# Patient Record
Sex: Female | Born: 1985 | Race: White | Hispanic: No
Health system: Southern US, Community
[De-identification: ages and names within clinical notes are randomized; demographics above are authoritative.]

---

## 2008-12-14 ENCOUNTER — Other Ambulatory Visit: Admission: RE | Admit: 2008-12-14 | Discharge: 2008-12-14 | Payer: Self-pay | Admitting: Unknown Physician Specialty

## 2010-01-10 ENCOUNTER — Other Ambulatory Visit
Admission: RE | Admit: 2010-01-10 | Discharge: 2010-01-10 | Payer: Self-pay | Source: Home / Self Care | Admitting: Obstetrics and Gynecology

## 2010-03-15 ENCOUNTER — Other Ambulatory Visit: Payer: Self-pay | Admitting: Obstetrics & Gynecology

## 2010-03-15 DIAGNOSIS — Z0489 Encounter for examination and observation for other specified reasons: Secondary | ICD-10-CM

## 2010-03-15 DIAGNOSIS — O2690 Pregnancy related conditions, unspecified, unspecified trimester: Secondary | ICD-10-CM

## 2010-03-21 ENCOUNTER — Ambulatory Visit (HOSPITAL_COMMUNITY)
Admission: RE | Admit: 2010-03-21 | Discharge: 2010-03-21 | Disposition: A | Discharge status: Home / Self Care | Payer: Medicaid Other | Source: Ambulatory Visit | Attending: Obstetrics & Gynecology | Admitting: Obstetrics & Gynecology

## 2010-03-21 ENCOUNTER — Other Ambulatory Visit: Payer: Self-pay | Admitting: Obstetrics & Gynecology

## 2010-03-21 DIAGNOSIS — Z0489 Encounter for examination and observation for other specified reasons: Secondary | ICD-10-CM

## 2010-03-21 DIAGNOSIS — O2690 Pregnancy related conditions, unspecified, unspecified trimester: Secondary | ICD-10-CM

## 2010-03-21 DIAGNOSIS — IMO0001 Reserved for inherently not codable concepts without codable children: Secondary | ICD-10-CM | POA: Insufficient documentation

## 2010-03-21 DIAGNOSIS — Z3689 Encounter for other specified antenatal screening: Secondary | ICD-10-CM | POA: Insufficient documentation

## 2010-03-21 DIAGNOSIS — Q999 Chromosomal abnormality, unspecified: Secondary | ICD-10-CM

## 2010-04-10 ENCOUNTER — Ambulatory Visit (HOSPITAL_COMMUNITY): Payer: Medicaid Other

## 2010-04-12 ENCOUNTER — Other Ambulatory Visit: Payer: Self-pay | Admitting: Obstetrics & Gynecology

## 2010-04-12 ENCOUNTER — Ambulatory Visit (HOSPITAL_COMMUNITY)
Admission: RE | Admit: 2010-04-12 | Discharge: 2010-04-12 | Disposition: A | Discharge status: Home / Self Care | Payer: Medicaid Other | Source: Ambulatory Visit | Attending: Obstetrics & Gynecology | Admitting: Obstetrics & Gynecology

## 2010-04-12 DIAGNOSIS — O358XX0 Maternal care for other (suspected) fetal abnormality and damage, not applicable or unspecified: Secondary | ICD-10-CM | POA: Insufficient documentation

## 2010-04-12 DIAGNOSIS — Q999 Chromosomal abnormality, unspecified: Secondary | ICD-10-CM

## 2010-04-12 DIAGNOSIS — IMO0001 Reserved for inherently not codable concepts without codable children: Secondary | ICD-10-CM | POA: Insufficient documentation

## 2010-04-12 DIAGNOSIS — Z3689 Encounter for other specified antenatal screening: Secondary | ICD-10-CM | POA: Insufficient documentation

## 2010-04-19 ENCOUNTER — Other Ambulatory Visit: Payer: Self-pay | Admitting: Obstetrics & Gynecology

## 2010-04-19 ENCOUNTER — Ambulatory Visit (HOSPITAL_COMMUNITY)
Admission: RE | Admit: 2010-04-19 | Discharge: 2010-04-19 | Disposition: A | Discharge status: Home / Self Care | Payer: Medicaid Other | Source: Ambulatory Visit | Attending: Obstetrics & Gynecology | Admitting: Obstetrics & Gynecology

## 2010-04-19 DIAGNOSIS — O358XX0 Maternal care for other (suspected) fetal abnormality and damage, not applicable or unspecified: Secondary | ICD-10-CM

## 2010-04-19 DIAGNOSIS — Q999 Chromosomal abnormality, unspecified: Secondary | ICD-10-CM

## 2010-04-19 DIAGNOSIS — IMO0001 Reserved for inherently not codable concepts without codable children: Secondary | ICD-10-CM | POA: Insufficient documentation

## 2010-04-19 DIAGNOSIS — O26879 Cervical shortening, unspecified trimester: Secondary | ICD-10-CM

## 2010-04-20 ENCOUNTER — Ambulatory Visit (HOSPITAL_COMMUNITY): Payer: Medicaid Other

## 2010-04-26 ENCOUNTER — Ambulatory Visit (HOSPITAL_COMMUNITY)
Admission: RE | Admit: 2010-04-26 | Discharge: 2010-04-26 | Disposition: A | Discharge status: Home / Self Care | Payer: Medicaid Other | Source: Ambulatory Visit | Attending: Obstetrics & Gynecology | Admitting: Obstetrics & Gynecology

## 2010-04-26 ENCOUNTER — Other Ambulatory Visit: Payer: Self-pay | Admitting: Obstetrics & Gynecology

## 2010-04-26 ENCOUNTER — Ambulatory Visit (HOSPITAL_COMMUNITY): Admission: RE | Admit: 2010-04-26 | Payer: Medicaid Other | Source: Ambulatory Visit

## 2010-04-26 ENCOUNTER — Encounter (HOSPITAL_COMMUNITY): Payer: Self-pay

## 2010-04-26 ENCOUNTER — Other Ambulatory Visit (HOSPITAL_COMMUNITY): Payer: Medicaid Other

## 2010-04-26 DIAGNOSIS — O26879 Cervical shortening, unspecified trimester: Secondary | ICD-10-CM | POA: Insufficient documentation

## 2010-04-26 DIAGNOSIS — O358XX0 Maternal care for other (suspected) fetal abnormality and damage, not applicable or unspecified: Secondary | ICD-10-CM | POA: Insufficient documentation

## 2010-04-26 DIAGNOSIS — IMO0001 Reserved for inherently not codable concepts without codable children: Secondary | ICD-10-CM

## 2010-05-03 ENCOUNTER — Ambulatory Visit (HOSPITAL_COMMUNITY): Payer: Medicaid Other

## 2010-05-10 ENCOUNTER — Ambulatory Visit (HOSPITAL_COMMUNITY): Payer: Medicaid Other

## 2010-05-17 ENCOUNTER — Ambulatory Visit (HOSPITAL_COMMUNITY): Payer: Medicaid Other

## 2010-05-24 ENCOUNTER — Ambulatory Visit (HOSPITAL_COMMUNITY): Payer: Medicaid Other

## 2010-05-28 ENCOUNTER — Inpatient Hospital Stay (HOSPITAL_COMMUNITY)
Admission: AD | Admit: 2010-05-28 | Discharge: 2010-06-03 | DRG: 372 | Disposition: A | Discharge status: Home / Self Care | Payer: BC Managed Care – PPO | Source: Ambulatory Visit | Attending: Obstetrics & Gynecology | Admitting: Obstetrics & Gynecology

## 2010-05-28 DIAGNOSIS — O26879 Cervical shortening, unspecified trimester: Principal | ICD-10-CM | POA: Diagnosis present

## 2010-05-28 LAB — CBC
HCT: 33.9 % — ABNORMAL LOW (ref 36.0–46.0)
Hemoglobin: 11.5 g/dL — ABNORMAL LOW (ref 12.0–15.0)
MCHC: 33.9 g/dL (ref 30.0–36.0)
MCV: 87.6 fL (ref 78.0–100.0)
RDW: 14.4 % (ref 11.5–15.5)

## 2010-05-28 LAB — COMPREHENSIVE METABOLIC PANEL
ALT: 8 U/L (ref 0–35)
BUN: 3 mg/dL — ABNORMAL LOW (ref 6–23)
Calcium: 8.2 mg/dL — ABNORMAL LOW (ref 8.4–10.5)
Glucose, Bld: 96 mg/dL (ref 70–99)
Sodium: 130 mEq/L — ABNORMAL LOW (ref 135–145)
Total Protein: 6.1 g/dL (ref 6.0–8.3)

## 2010-05-28 LAB — URINALYSIS, DIPSTICK ONLY
Bilirubin Urine: NEGATIVE
Ketones, ur: 40 mg/dL — AB
Leukocytes, UA: NEGATIVE
Nitrite: NEGATIVE
Protein, ur: NEGATIVE mg/dL

## 2010-05-28 LAB — RAPID URINE DRUG SCREEN, HOSP PERFORMED
Amphetamines: NOT DETECTED
Barbiturates: NOT DETECTED
Benzodiazepines: NOT DETECTED
Cocaine: NOT DETECTED
Opiates: NOT DETECTED

## 2010-06-01 ENCOUNTER — Other Ambulatory Visit: Payer: Self-pay | Admitting: Obstetrics & Gynecology

## 2010-06-01 DIAGNOSIS — O26879 Cervical shortening, unspecified trimester: Secondary | ICD-10-CM

## 2010-06-01 LAB — CBC
HCT: 31.9 % — ABNORMAL LOW (ref 36.0–46.0)
Hemoglobin: 10.5 g/dL — ABNORMAL LOW (ref 12.0–15.0)
MCV: 89.4 fL (ref 78.0–100.0)
RBC: 3.57 MIL/uL — ABNORMAL LOW (ref 3.87–5.11)
WBC: 11.6 10*3/uL — ABNORMAL HIGH (ref 4.0–10.5)

## 2010-06-02 LAB — CBC
HCT: 30.7 % — ABNORMAL LOW (ref 36.0–46.0)
Hemoglobin: 9.9 g/dL — ABNORMAL LOW (ref 12.0–15.0)
MCHC: 32.2 g/dL (ref 30.0–36.0)
MCV: 90 fL (ref 78.0–100.0)
RDW: 14.6 % (ref 11.5–15.5)
WBC: 12 10*3/uL — ABNORMAL HIGH (ref 4.0–10.5)

## 2010-06-02 LAB — DIFFERENTIAL
Eosinophils Relative: 0 % (ref 0–5)
Lymphocytes Relative: 10 % — ABNORMAL LOW (ref 12–46)
Lymphs Abs: 1.3 10*3/uL (ref 0.7–4.0)
Monocytes Absolute: 0.8 10*3/uL (ref 0.1–1.0)

## 2010-06-05 ENCOUNTER — Encounter (HOSPITAL_COMMUNITY): Payer: Medicaid Other

## 2010-06-14 NOTE — Discharge Summary (Signed)
[name], [name]          ACCOUNT NO.:  [id number]  MEDICAL RECORD NO.:  [id number]           PATIENT TYPE:  I  LOCATION:  9303                          FACILITY:  [facility]  PHYSICIAN:  Lesly Dukes, M.D. DATE OF BIRTH:  [birth year]  DATE OF ADMISSION:  05/28/2010 DATE OF DISCHARGE:  06/03/2010                              DISCHARGE SUMMARY   ADMITTING DIAGNOSES: 1. Intrauterine pregnancy at 30-5/7 weeks. 2. Preterm labor with cervical dilation. 3. Transfer from [facility].  DISCHARGE DIAGNOSES: 1. Intrauterine pregnancy at 30-5/7 weeks. 2. Preterm labor with cervical dilation. 3. Transfer from [facility].  PROCEDURE:  Spontaneous vaginal delivery.  HOSPITAL COURSE:  [name] is a 25 year old primigravida who presented as a transfer from [facility] for preterm labor.  EDC was determined to be August 01, 2010, by a first trimester ultrasound.  Her prenatal care has been followed by the [facility] and has been remarkable for; 1. Positive marijuana screen in pregnancy. 2. Two-vessel cord. 3. Cervical shortening requiring 17P.        Upon arrival to [facility], the patient was already receiving     magnesium sulfate therapy.  She was started on penicillin and was     given a room in the antenatal unit.  She had previously received     betamethasone dosing at [facility] at the end of March.  She was     started on Indocin in addition to the magnesium.  In the afternoon     of May 17, she was noted to have increased contractions and was     requesting pain medications.  At that time she was noted to be 6 cm     dilated and was transferred to Labor and Delivery and preparations     for expectant management were put in place.  The patient progressed     to complete dilation followed by delivery at approximately 8:05     p.m. on May 17.  Infant was a viable female and was taken to NICU.     The patient was taken to the third floor unit  in good condition.     She had an overnight temperature of 101.8 the night of     delivery but that resolved with Tylenol and she had no further     temperature elevations after that.  Her postpartum day #1 CBC,     hemoglobin 9.9, hematocrit 30.7, white blood cell count 12.0,     platelets 176,000.  Neutrophils were elevated and lymphocytes were     slightly decreased.  She was pumping her breasts.  Physical exam was within normal limits.  She was undecided about contraception.  By postpartum day #2, the patient continued to do well. Her vital signs remained stable.  She was deemed to have received the full benefit of her hospital stay.  She was discharged home.  Discharge instructions per postpartum handout.  DISCHARGE MEDICATIONS: 1. Motrin 600 mg 1 p.o. q.6 h. p.r.n. pain. 2. Prenatal vitamin 1 p.o. daily.  Discharge followup will occur at [facility] in 4-6 weeks or as needed.  Cam Hai, C.N.M.   ______________________________ Lesly Dukes, M.D.    KS/MEDQ  D:  06/03/2010  T:  06/04/2010  Job:  [id number]  Electronically Signed by Cam Hai C.N.M. on 06/08/2010 08:30:49 AM Electronically Signed by Elsie Lincoln M.D. on 06/14/2010 07:34:26 PM

## 2010-10-31 ENCOUNTER — Encounter (HOSPITAL_COMMUNITY): Payer: Self-pay | Admitting: *Deleted

## 2013-01-29 IMAGING — US US OB TRANSVAGINAL
1 series · 14 of 28 positions shown · non-contrast
Comparison: none

[Series 1: us ob transvaginal · 0.12mm/px · 14 of 39 slices shown]
[im 2/39]
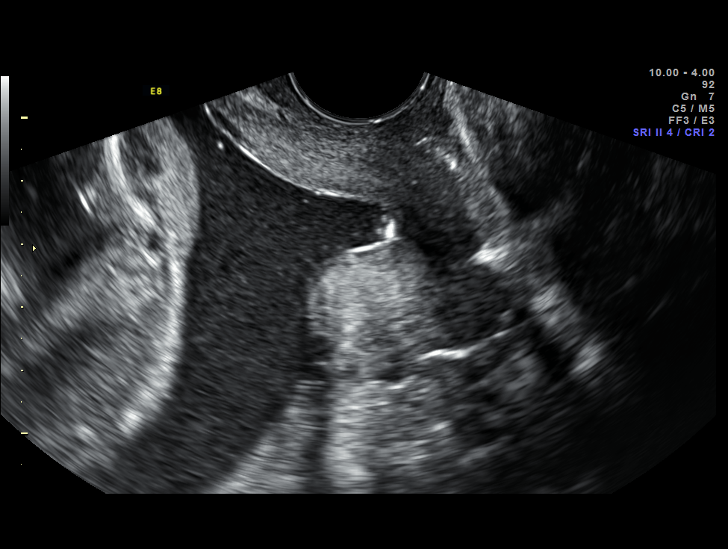
[im 5/39]
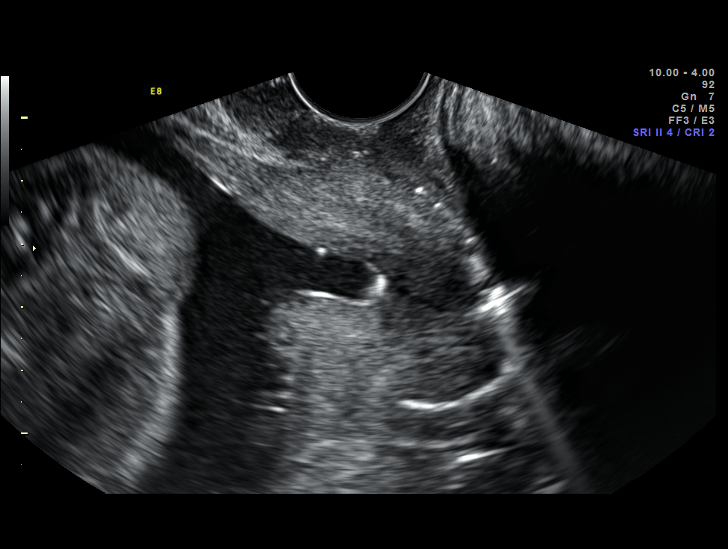
[im 8/39]
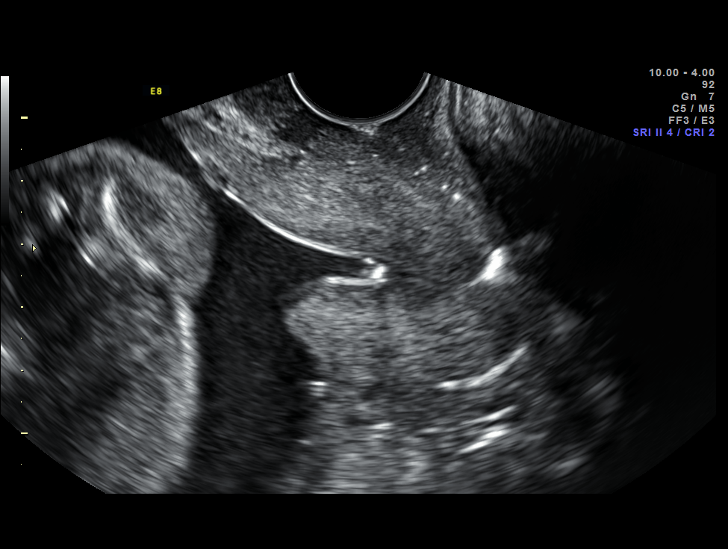
[im 10/39]
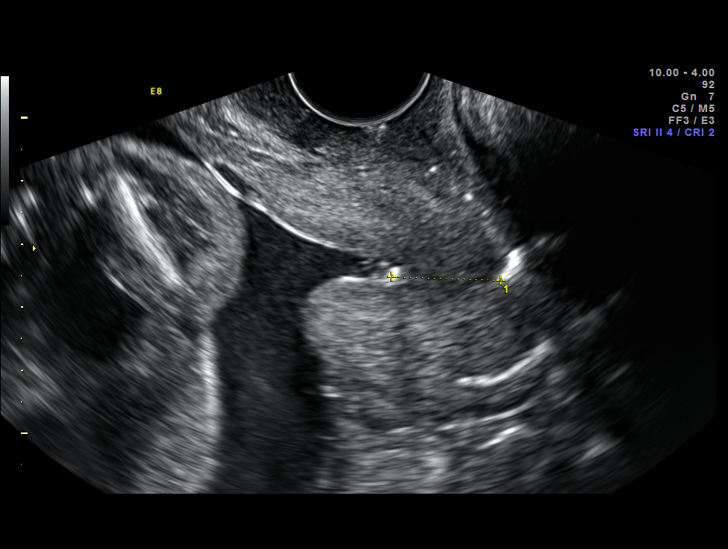
[im 13/39]
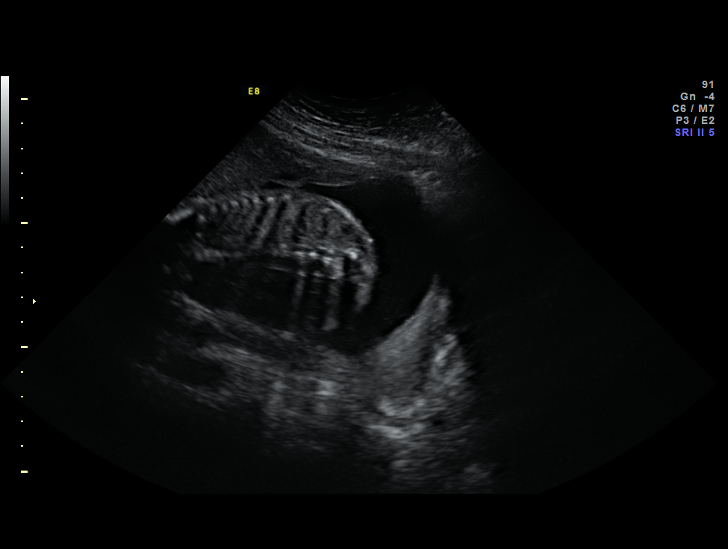
[im 16/39]
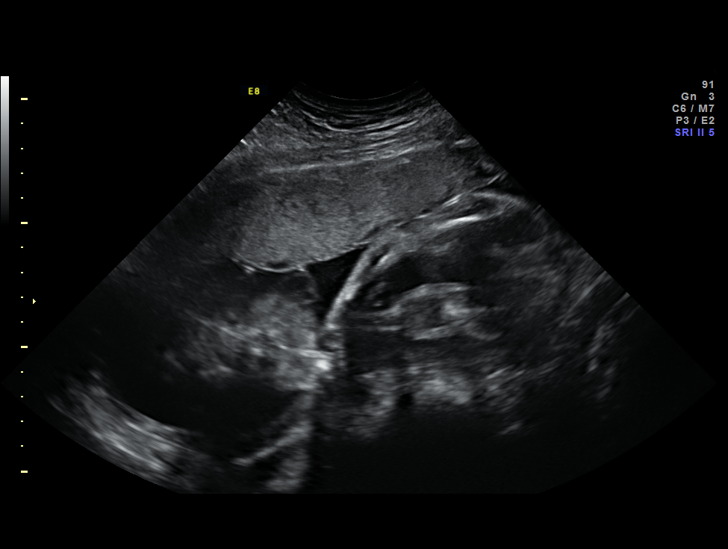
[im 19/39]
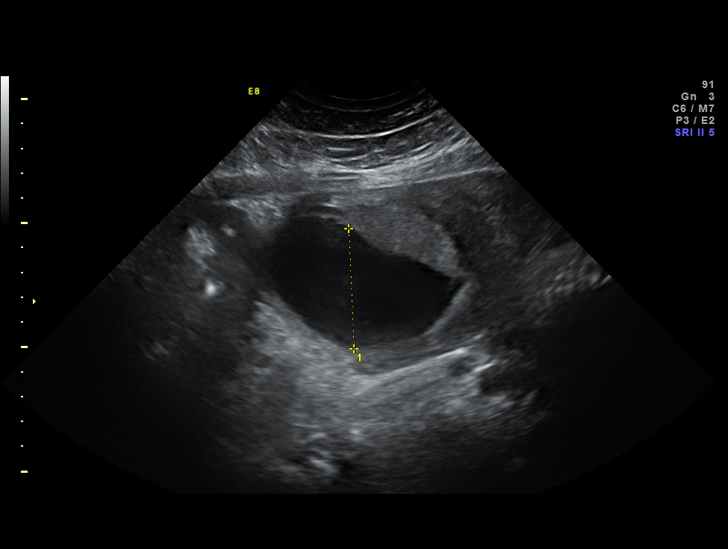
[im 22/39]
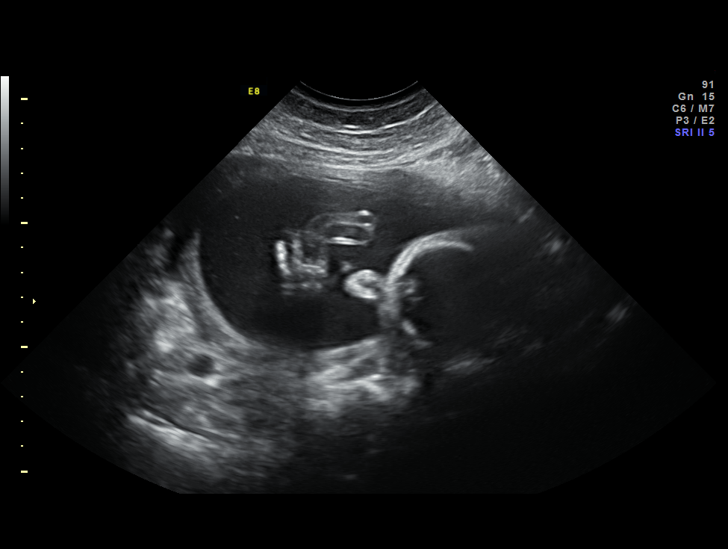
[im 24/39]
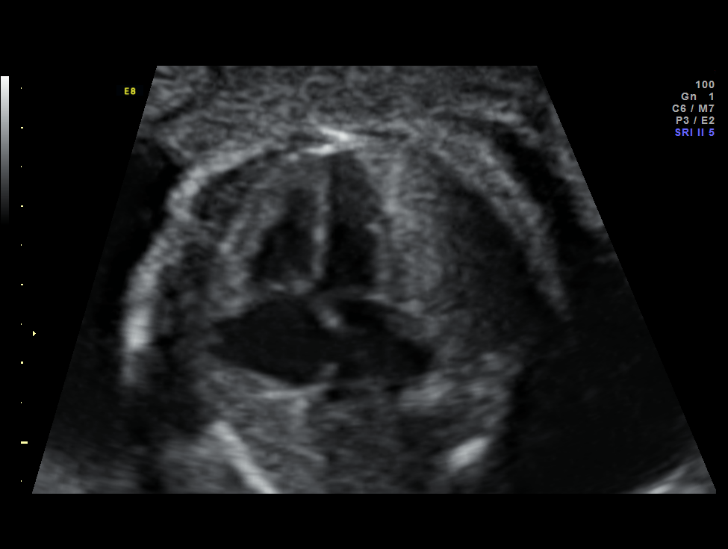
[im 27/39]
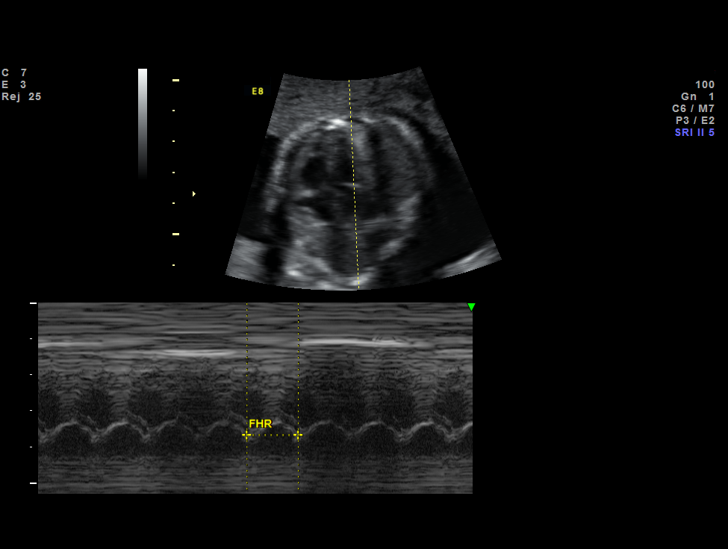
[im 30/39]
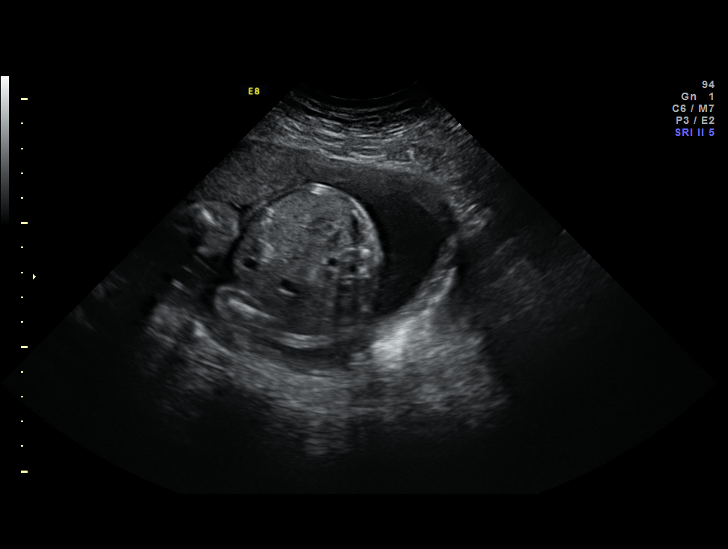
[im 33/39]
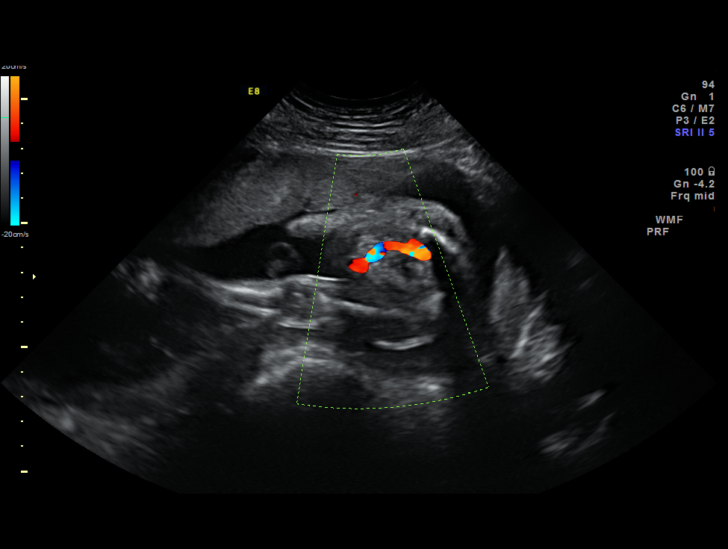
[im 36/39]
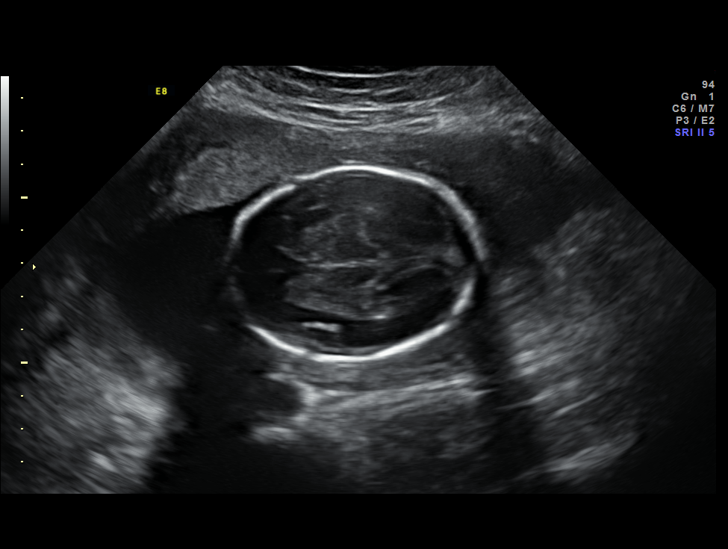
[im 39/39]
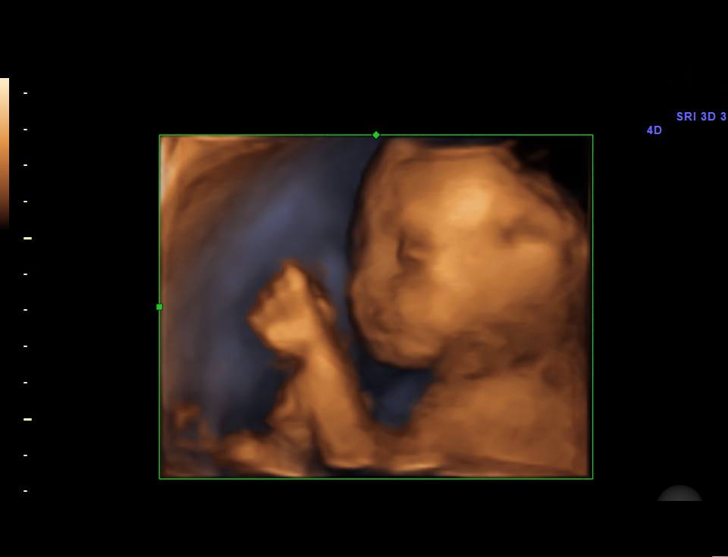

[14 of 28 positions shown; findings below may reference images not displayed]

Canned report from images found in remote index.

Refer to host system for actual result text.

## 2013-11-16 ENCOUNTER — Encounter (HOSPITAL_COMMUNITY): Payer: Self-pay | Admitting: *Deleted

## 2017-05-15 ENCOUNTER — Encounter: Payer: Self-pay | Admitting: *Deleted

## 2018-03-13 ENCOUNTER — Telehealth: Payer: Self-pay | Admitting: Obstetrics & Gynecology

## 2018-03-13 NOTE — Telephone Encounter (Signed)
Patient called stating that she went to the ER and they told her that her HCG levels are low and that she needs to make and appointment in 48 hours to re-check her levels. Pt is not our patient and has not been seen here in a long time. Please contact pt if we could schedule lab work.

## 2018-03-13 NOTE — Telephone Encounter (Signed)
Patient states she went to Urgent Care first for cramping but was then sent to [facility].  States they did an u/s but was unable to visualize anything.  Blood work was done but she does not know what the level was.  Advised patient to have records sent to [place] so that they can be reviewed and figure out where to start with her care.  Informed she could come to our office to sign a consent or the hospital. Pt verbalized understanding and stated she would call back.

## 2019-05-29 ENCOUNTER — Ambulatory Visit: Payer: Medicaid Other | Attending: Internal Medicine

## 2019-05-29 DIAGNOSIS — Z23 Encounter for immunization: Secondary | ICD-10-CM

## 2019-05-29 NOTE — Progress Notes (Signed)
   Covid-19 Vaccination Clinic  Name:  [name]    MRN: [id number] DOB: Jun 27, 1985  05/29/2019  Ms. Galka was observed post Covid-19 immunization for 15 minutes without incident. She was provided with Vaccine Information Sheet and instruction to access the V-Safe system.   Ms. Sparlin was instructed to call 911 with any severe reactions post vaccine: Marland Kitchen Difficulty breathing  . Swelling of face and throat  . A fast heartbeat  . A bad rash all over body  . Dizziness and weakness   Immunizations Administered    Name Date Dose VIS Date Route   Pfizer COVID-19 Vaccine 05/29/2019 11:41 AM 0.3 mL 03/11/2018 Intramuscular   Manufacturer: [employer], [employer]   Lot: [id number]   NDC: [address]-4

## 2019-06-19 ENCOUNTER — Ambulatory Visit: Payer: Medicaid Other

## 2019-06-19 DIAGNOSIS — Z23 Encounter for immunization: Secondary | ICD-10-CM

## 2019-06-19 NOTE — Progress Notes (Signed)
   Covid-19 Vaccination Clinic  Name:  [name]    MRN: [id number] DOB: Jun 10, 1985  06/19/2019  [name] was observed post Covid-19 immunization for 15 minutes without incident. She was provided with Vaccine Information Sheet and instruction to access the V-Safe system.   [name] was instructed to call 911 with any severe reactions post vaccine: Marland Kitchen Difficulty breathing  . Swelling of face and throat  . A fast heartbeat  . A bad rash all over body  . Dizziness and weakness   Immunizations Administered    Name Date Dose VIS Date Route   Pfizer COVID-19 Vaccine 06/19/2019  3:36 PM 0.3 mL 03/11/2018 Intramuscular   Manufacturer: [employer], [employer]   Lot: [id number]   NDC: [address]-3
